# Patient Record
Sex: Female | Born: 1950 | Race: White | Hispanic: No | Smoking: Current every day smoker
Health system: Southern US, Community
[De-identification: ages and names within clinical notes are randomized; demographics above are authoritative.]

## PROBLEM LIST (undated history)

## (undated) DIAGNOSIS — M1990 Unspecified osteoarthritis, unspecified site: Secondary | ICD-10-CM

## (undated) DIAGNOSIS — J302 Other seasonal allergic rhinitis: Secondary | ICD-10-CM

## (undated) HISTORY — PX: DILATION AND CURETTAGE OF UTERUS: SHX78

## (undated) HISTORY — DX: Other seasonal allergic rhinitis: J30.2

## (undated) HISTORY — PX: BREAST BIOPSY: SHX20

## (undated) HISTORY — PX: OTHER SURGICAL HISTORY: SHX169

## (undated) HISTORY — DX: Unspecified osteoarthritis, unspecified site: M19.90

## (undated) HISTORY — PX: BREAST EXCISIONAL BIOPSY: SUR124

---

## 2001-01-25 ENCOUNTER — Encounter: Payer: Self-pay | Admitting: Neurosurgery

## 2001-01-25 ENCOUNTER — Inpatient Hospital Stay (HOSPITAL_COMMUNITY): Admission: RE | Admit: 2001-01-25 | Discharge: 2001-01-26 | Payer: Self-pay | Admitting: Neurosurgery

## 2001-02-17 ENCOUNTER — Encounter: Admission: RE | Admit: 2001-02-17 | Discharge: 2001-02-17 | Payer: Self-pay | Admitting: Neurosurgery

## 2001-02-17 ENCOUNTER — Encounter: Payer: Self-pay | Admitting: Neurosurgery

## 2005-02-25 ENCOUNTER — Ambulatory Visit (HOSPITAL_COMMUNITY): Admission: RE | Admit: 2005-02-25 | Discharge: 2005-02-25 | Payer: Self-pay | Admitting: Family Medicine

## 2005-03-26 ENCOUNTER — Ambulatory Visit (HOSPITAL_COMMUNITY): Admission: RE | Admit: 2005-03-26 | Discharge: 2005-03-26 | Payer: Self-pay | Admitting: Internal Medicine

## 2005-03-26 ENCOUNTER — Ambulatory Visit: Payer: Self-pay | Admitting: Internal Medicine

## 2006-08-31 ENCOUNTER — Ambulatory Visit (HOSPITAL_COMMUNITY): Admission: RE | Admit: 2006-08-31 | Discharge: 2006-08-31 | Payer: Self-pay | Admitting: Obstetrics and Gynecology

## 2012-05-11 ENCOUNTER — Other Ambulatory Visit (HOSPITAL_COMMUNITY): Payer: Self-pay | Admitting: Family Medicine

## 2012-05-11 ENCOUNTER — Ambulatory Visit (HOSPITAL_COMMUNITY)
Admission: RE | Admit: 2012-05-11 | Discharge: 2012-05-11 | Disposition: A | Discharge status: Home / Self Care | Payer: 59 | Source: Ambulatory Visit | Attending: Family Medicine | Admitting: Family Medicine

## 2012-05-11 DIAGNOSIS — R5383 Other fatigue: Secondary | ICD-10-CM

## 2012-05-11 DIAGNOSIS — Z139 Encounter for screening, unspecified: Secondary | ICD-10-CM

## 2012-05-11 DIAGNOSIS — F17200 Nicotine dependence, unspecified, uncomplicated: Secondary | ICD-10-CM | POA: Insufficient documentation

## 2012-05-11 DIAGNOSIS — R05 Cough: Secondary | ICD-10-CM | POA: Insufficient documentation

## 2012-05-11 DIAGNOSIS — R5381 Other malaise: Secondary | ICD-10-CM

## 2012-05-11 DIAGNOSIS — R059 Cough, unspecified: Secondary | ICD-10-CM | POA: Insufficient documentation

## 2012-05-16 ENCOUNTER — Ambulatory Visit (HOSPITAL_COMMUNITY)
Admission: RE | Admit: 2012-05-16 | Discharge: 2012-05-16 | Disposition: A | Discharge status: Home / Self Care | Payer: 59 | Source: Ambulatory Visit | Attending: Family Medicine | Admitting: Family Medicine

## 2012-05-16 DIAGNOSIS — Z1231 Encounter for screening mammogram for malignant neoplasm of breast: Secondary | ICD-10-CM | POA: Insufficient documentation

## 2012-05-16 DIAGNOSIS — Z139 Encounter for screening, unspecified: Secondary | ICD-10-CM

## 2013-06-19 IMAGING — MG MM DIGITAL SCREENING BILAT
4 series · 4 of 4 positions shown · non-contrast
Comparison: Previous exams.

CLINICAL DATA: Screening.

DIGITAL BILATERAL SCREENING MAMMOGRAM WITH CAD

[L CC]
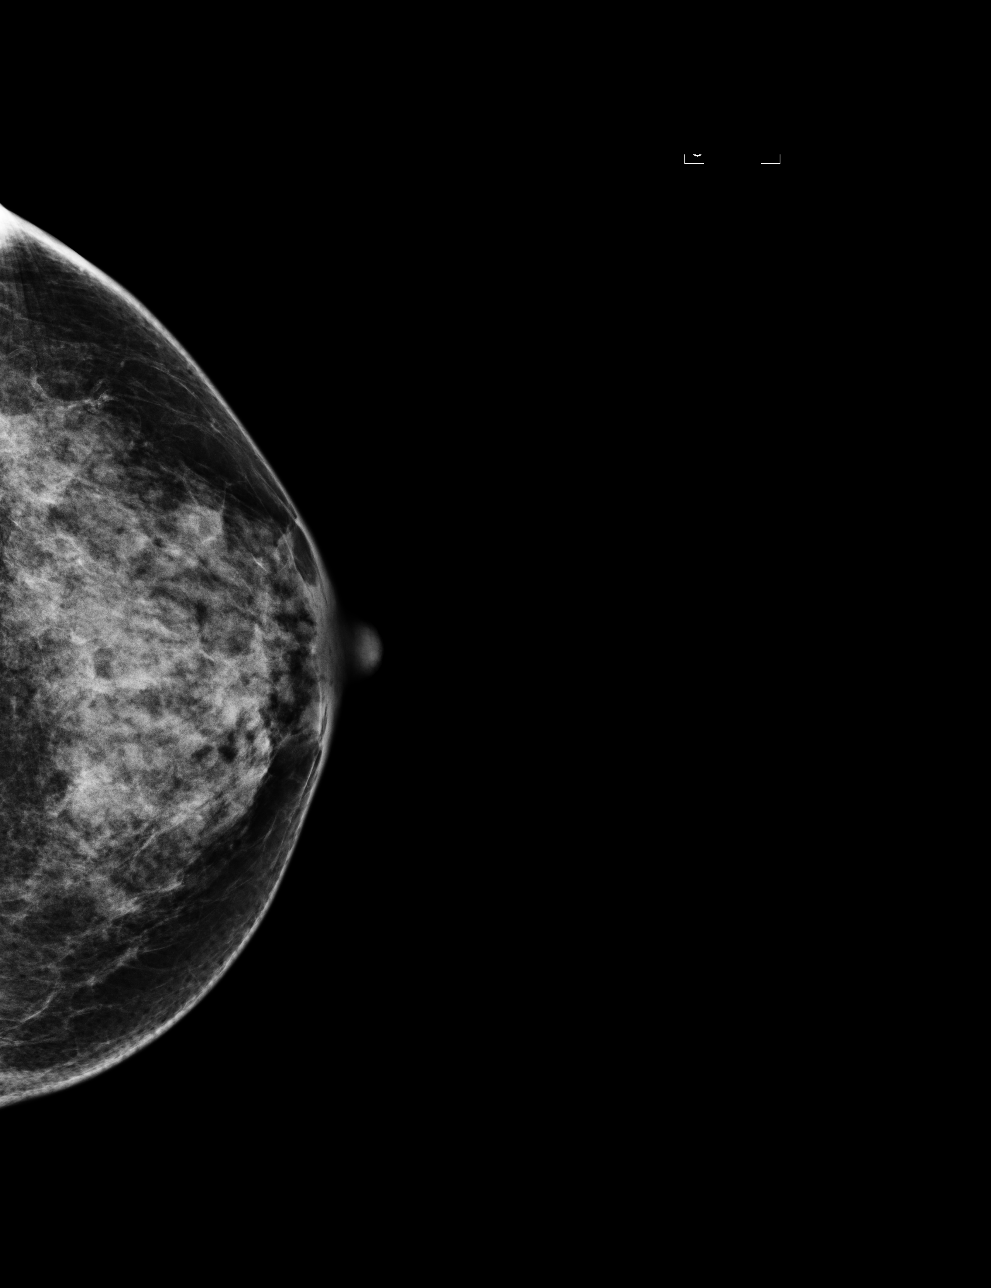

[L MLO]
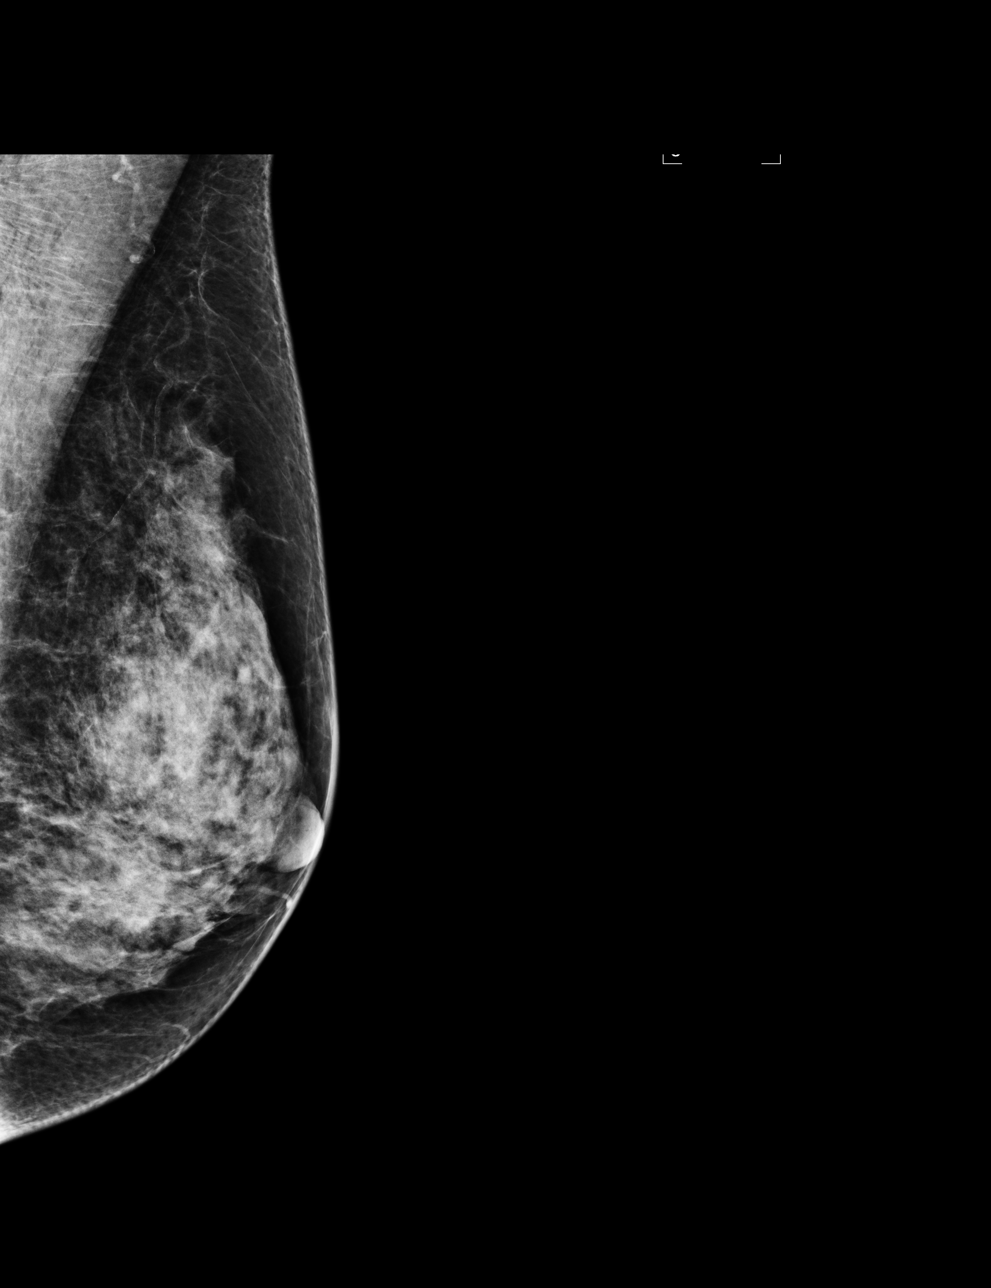

[R CC]
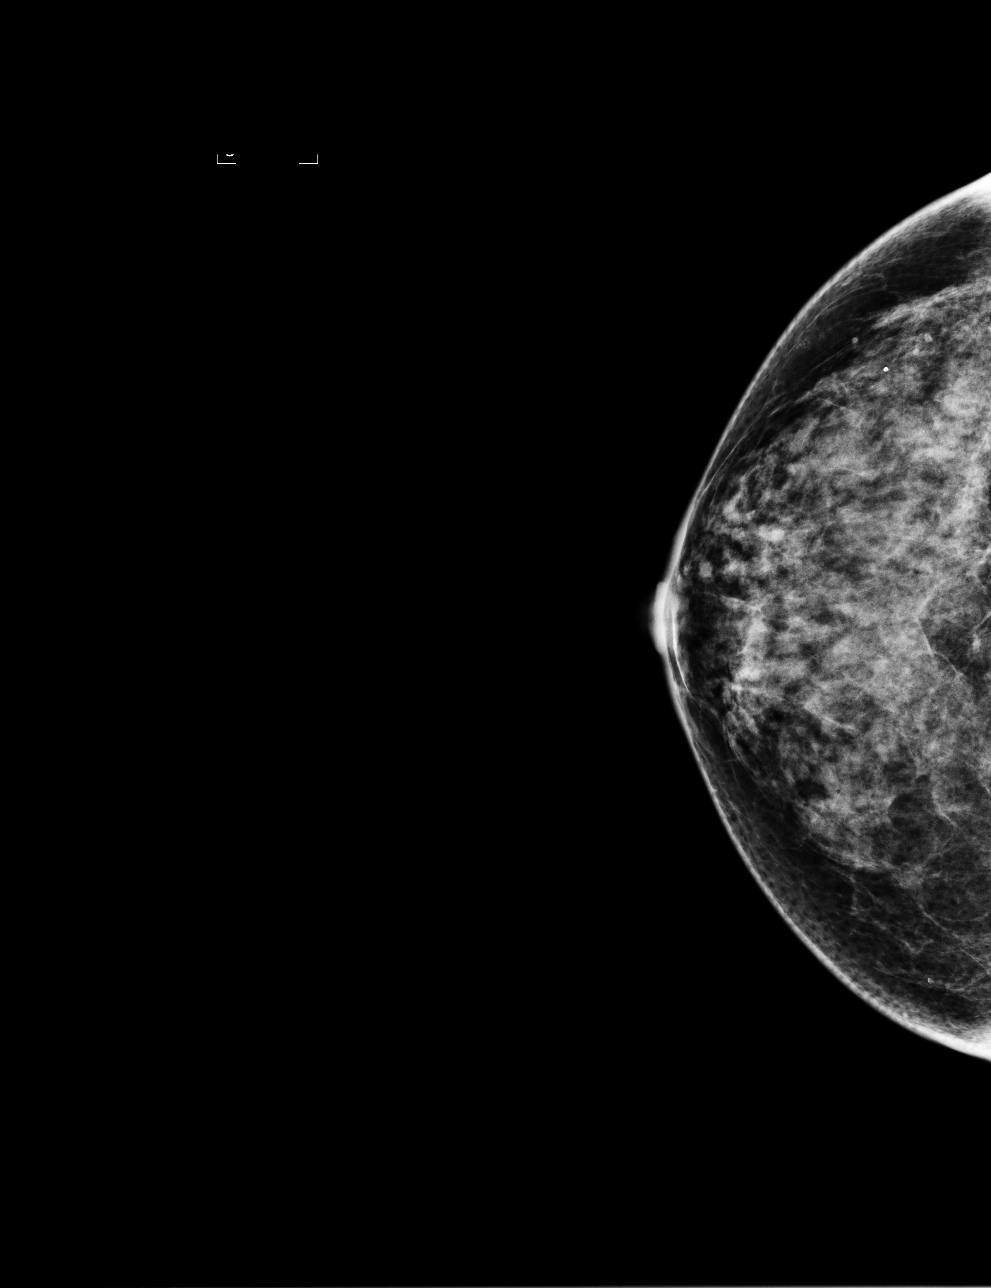

[R MLO]
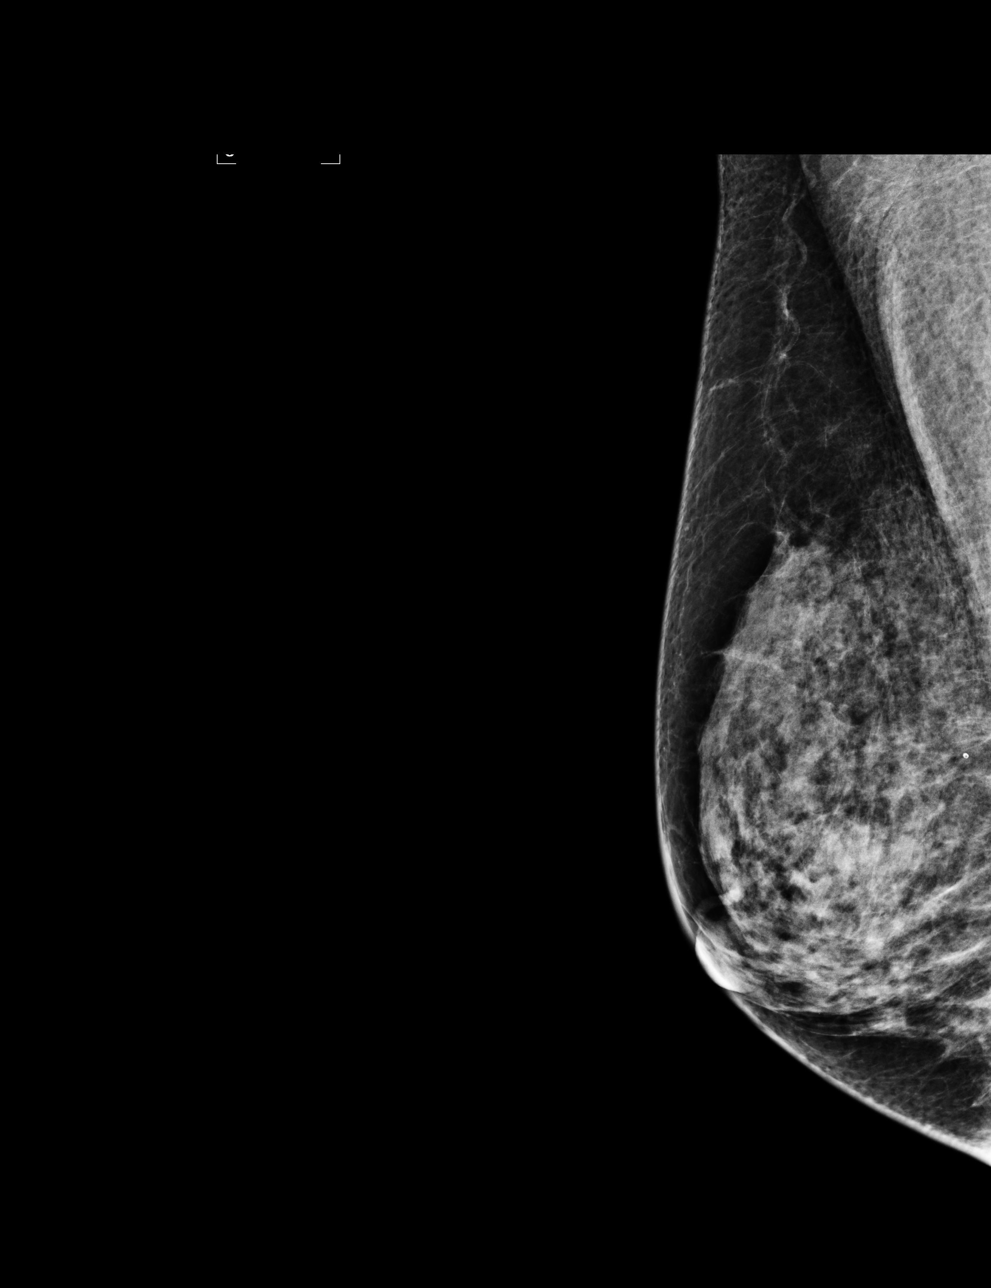

[4 of 4 positions shown; findings below may reference images not displayed]

FINDINGS: The breast tissue is extremely dense. No suspicious
masses, architectural distortion, or calcifications are present.

Images were processed with CAD.
IMPRESSION: No mammographic evidence of malignancy.

A result letter of this screening mammogram will be mailed directly
to the patient.

RECOMMENDATION:
Screening mammogram in one year. (Code:FL-P-WKB)

BI-RADS CATEGORY 1:  Negative.

## 2014-03-08 ENCOUNTER — Other Ambulatory Visit (HOSPITAL_COMMUNITY): Payer: Self-pay | Admitting: Family Medicine

## 2014-03-08 DIAGNOSIS — Z139 Encounter for screening, unspecified: Secondary | ICD-10-CM

## 2014-03-15 ENCOUNTER — Ambulatory Visit (HOSPITAL_COMMUNITY)
Admission: RE | Admit: 2014-03-15 | Discharge: 2014-03-15 | Disposition: A | Discharge status: Home / Self Care | Payer: 59 | Source: Ambulatory Visit | Attending: Family Medicine | Admitting: Family Medicine

## 2014-03-15 DIAGNOSIS — Z1231 Encounter for screening mammogram for malignant neoplasm of breast: Secondary | ICD-10-CM | POA: Insufficient documentation

## 2014-03-15 DIAGNOSIS — Z139 Encounter for screening, unspecified: Secondary | ICD-10-CM

## 2014-03-15 DIAGNOSIS — R928 Other abnormal and inconclusive findings on diagnostic imaging of breast: Secondary | ICD-10-CM | POA: Insufficient documentation

## 2014-03-19 ENCOUNTER — Other Ambulatory Visit: Payer: Self-pay | Admitting: Family Medicine

## 2014-03-19 DIAGNOSIS — R928 Other abnormal and inconclusive findings on diagnostic imaging of breast: Secondary | ICD-10-CM

## 2014-04-03 ENCOUNTER — Other Ambulatory Visit: Payer: Self-pay | Admitting: Family Medicine

## 2014-04-03 ENCOUNTER — Ambulatory Visit (HOSPITAL_COMMUNITY)
Admission: RE | Admit: 2014-04-03 | Discharge: 2014-04-03 | Disposition: A | Discharge status: Home / Self Care | Payer: 59 | Source: Ambulatory Visit | Attending: Family Medicine | Admitting: Family Medicine

## 2014-04-03 DIAGNOSIS — R928 Other abnormal and inconclusive findings on diagnostic imaging of breast: Secondary | ICD-10-CM

## 2015-05-07 ENCOUNTER — Other Ambulatory Visit (HOSPITAL_COMMUNITY): Payer: Self-pay | Admitting: Physician Assistant

## 2015-05-07 ENCOUNTER — Ambulatory Visit (HOSPITAL_COMMUNITY)
Admission: RE | Admit: 2015-05-07 | Discharge: 2015-05-07 | Disposition: A | Discharge status: Home / Self Care | Payer: 59 | Source: Ambulatory Visit | Attending: Physician Assistant | Admitting: Physician Assistant

## 2015-05-07 DIAGNOSIS — M25551 Pain in right hip: Secondary | ICD-10-CM | POA: Diagnosis not present

## 2015-06-05 ENCOUNTER — Encounter (INDEPENDENT_AMBULATORY_CARE_PROVIDER_SITE_OTHER): Payer: Self-pay | Admitting: *Deleted

## 2015-07-03 ENCOUNTER — Ambulatory Visit (INDEPENDENT_AMBULATORY_CARE_PROVIDER_SITE_OTHER): Payer: 59 | Admitting: Internal Medicine

## 2015-08-05 ENCOUNTER — Encounter (INDEPENDENT_AMBULATORY_CARE_PROVIDER_SITE_OTHER): Payer: Self-pay | Admitting: Internal Medicine

## 2015-08-05 ENCOUNTER — Ambulatory Visit (INDEPENDENT_AMBULATORY_CARE_PROVIDER_SITE_OTHER): Payer: 59 | Admitting: Internal Medicine

## 2015-08-05 ENCOUNTER — Telehealth (INDEPENDENT_AMBULATORY_CARE_PROVIDER_SITE_OTHER): Payer: Self-pay | Admitting: *Deleted

## 2015-08-05 ENCOUNTER — Other Ambulatory Visit (INDEPENDENT_AMBULATORY_CARE_PROVIDER_SITE_OTHER): Payer: Self-pay | Admitting: Internal Medicine

## 2015-08-05 DIAGNOSIS — K921 Melena: Secondary | ICD-10-CM | POA: Diagnosis not present

## 2015-08-05 DIAGNOSIS — Z1211 Encounter for screening for malignant neoplasm of colon: Secondary | ICD-10-CM

## 2015-08-05 DIAGNOSIS — J302 Other seasonal allergic rhinitis: Secondary | ICD-10-CM | POA: Insufficient documentation

## 2015-08-05 DIAGNOSIS — M1990 Unspecified osteoarthritis, unspecified site: Secondary | ICD-10-CM | POA: Insufficient documentation

## 2015-08-05 LAB — CBC
HEMATOCRIT: 42.2 % (ref 36.0–46.0)
Hemoglobin: 14.2 g/dL (ref 12.0–15.0)
MCH: 32.1 pg (ref 26.0–34.0)
MCHC: 33.6 g/dL (ref 30.0–36.0)
MCV: 95.3 fL (ref 78.0–100.0)
MPV: 8.9 fL (ref 8.6–12.4)
PLATELETS: 301 10*3/uL (ref 150–400)
RBC: 4.43 MIL/uL (ref 3.87–5.11)
RDW: 13.4 % (ref 11.5–15.5)
WBC: 6.7 10*3/uL (ref 4.0–10.5)

## 2015-08-05 MED ORDER — OMEPRAZOLE 40 MG PO CPDR: 40.0000 mg | DELAYED_RELEASE_CAPSULE | Freq: Every day | ORAL | Status: DC
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
## 2015-08-05 NOTE — Patient Instructions (Signed)
EGD/Colonoscopy. The risks and benefits such as perforation, bleeding, and infection were reviewed with the patient and is agreeable. 

## 2015-08-05 NOTE — Telephone Encounter (Signed)
Patient needs trilyte 

## 2015-08-11 MED ORDER — PEG 3350-KCL-NA BICARB-NACL 420 G PO SOLR: 4000.0000 mL | Freq: Once | ORAL | Status: DC

## 2015-09-25 ENCOUNTER — Telehealth (INDEPENDENT_AMBULATORY_CARE_PROVIDER_SITE_OTHER): Payer: Self-pay | Admitting: *Deleted

## 2015-09-25 NOTE — Telephone Encounter (Signed)
noted 

## 2015-09-25 NOTE — Telephone Encounter (Signed)
Patient left message to cancel TCS/EGD sch'd 10/16/15 -- Hospice has been called in for her mother and she states she won't be up to having procedures

## 2015-10-16 ENCOUNTER — Encounter (HOSPITAL_COMMUNITY): Admission: RE | Payer: Self-pay | Source: Ambulatory Visit

## 2015-10-16 ENCOUNTER — Ambulatory Visit (HOSPITAL_COMMUNITY): Admission: RE | Admit: 2015-10-16 | Payer: 59 | Source: Ambulatory Visit | Admitting: Internal Medicine

## 2015-10-16 SURGERY — COLONOSCOPY
Anesthesia: Moderate Sedation

## 2017-02-10 DIAGNOSIS — Z1389 Encounter for screening for other disorder: Secondary | ICD-10-CM | POA: Diagnosis not present

## 2017-02-10 DIAGNOSIS — T07XXXA Unspecified multiple injuries, initial encounter: Secondary | ICD-10-CM | POA: Diagnosis not present

## 2017-02-10 DIAGNOSIS — K5790 Diverticulosis of intestine, part unspecified, without perforation or abscess without bleeding: Secondary | ICD-10-CM | POA: Diagnosis not present

## 2017-02-10 DIAGNOSIS — Z23 Encounter for immunization: Secondary | ICD-10-CM | POA: Diagnosis not present

## 2017-02-10 DIAGNOSIS — L299 Pruritus, unspecified: Secondary | ICD-10-CM | POA: Diagnosis not present

## 2017-08-15 DIAGNOSIS — R599 Enlarged lymph nodes, unspecified: Secondary | ICD-10-CM | POA: Diagnosis not present

## 2017-08-15 DIAGNOSIS — Z6820 Body mass index (BMI) 20.0-20.9, adult: Secondary | ICD-10-CM | POA: Diagnosis not present

## 2017-08-15 DIAGNOSIS — Z1389 Encounter for screening for other disorder: Secondary | ICD-10-CM | POA: Diagnosis not present

## 2017-08-15 DIAGNOSIS — R221 Localized swelling, mass and lump, neck: Secondary | ICD-10-CM | POA: Diagnosis not present

## 2017-09-08 ENCOUNTER — Ambulatory Visit (INDEPENDENT_AMBULATORY_CARE_PROVIDER_SITE_OTHER): Payer: 59 | Admitting: Otolaryngology

## 2017-09-08 DIAGNOSIS — F17210 Nicotine dependence, cigarettes, uncomplicated: Secondary | ICD-10-CM | POA: Diagnosis not present

## 2017-09-08 DIAGNOSIS — R070 Pain in throat: Secondary | ICD-10-CM

## 2017-09-08 DIAGNOSIS — R221 Localized swelling, mass and lump, neck: Secondary | ICD-10-CM | POA: Diagnosis not present

## 2017-09-14 ENCOUNTER — Other Ambulatory Visit (INDEPENDENT_AMBULATORY_CARE_PROVIDER_SITE_OTHER): Payer: Self-pay | Admitting: Otolaryngology

## 2017-09-14 DIAGNOSIS — R221 Localized swelling, mass and lump, neck: Secondary | ICD-10-CM

## 2017-09-23 ENCOUNTER — Ambulatory Visit (HOSPITAL_COMMUNITY): Payer: 59

## 2017-10-07 ENCOUNTER — Other Ambulatory Visit (HOSPITAL_COMMUNITY): Payer: 59

## 2017-10-07 ENCOUNTER — Encounter (HOSPITAL_COMMUNITY): Payer: Self-pay

## 2017-10-18 ENCOUNTER — Ambulatory Visit (HOSPITAL_COMMUNITY)
Admission: RE | Admit: 2017-10-18 | Discharge: 2017-10-18 | Disposition: A | Discharge status: Home / Self Care | Payer: 59 | Source: Ambulatory Visit | Attending: Otolaryngology | Admitting: Otolaryngology

## 2017-10-18 DIAGNOSIS — R221 Localized swelling, mass and lump, neck: Secondary | ICD-10-CM | POA: Diagnosis not present

## 2017-10-18 DIAGNOSIS — I700 Atherosclerosis of aorta: Secondary | ICD-10-CM | POA: Diagnosis not present

## 2017-10-18 LAB — POCT I-STAT CREATININE: Creatinine, Ser: 0.8 mg/dL (ref 0.44–1.00)

## 2017-10-18 MED ORDER — IOPAMIDOL (ISOVUE-300) INJECTION 61%
75.0000 mL | Freq: Once | INTRAVENOUS | Status: AC | PRN
  Administered 2017-10-18: 75 mL via INTRAVENOUS

## 2018-06-22 ENCOUNTER — Other Ambulatory Visit (HOSPITAL_COMMUNITY): Payer: Self-pay | Admitting: Family Medicine

## 2018-06-22 ENCOUNTER — Ambulatory Visit (HOSPITAL_COMMUNITY)
Admission: RE | Admit: 2018-06-22 | Discharge: 2018-06-22 | Disposition: A | Discharge status: Home / Self Care | Payer: 59 | Source: Ambulatory Visit | Attending: Family Medicine | Admitting: Family Medicine

## 2018-06-22 DIAGNOSIS — R0600 Dyspnea, unspecified: Secondary | ICD-10-CM | POA: Diagnosis not present

## 2018-06-22 DIAGNOSIS — Z1389 Encounter for screening for other disorder: Secondary | ICD-10-CM

## 2018-06-22 DIAGNOSIS — R0602 Shortness of breath: Secondary | ICD-10-CM | POA: Diagnosis not present

## 2018-06-22 DIAGNOSIS — Z6821 Body mass index (BMI) 21.0-21.9, adult: Secondary | ICD-10-CM | POA: Diagnosis not present

## 2018-06-22 DIAGNOSIS — R071 Chest pain on breathing: Secondary | ICD-10-CM | POA: Diagnosis not present

## 2018-07-27 DIAGNOSIS — R69 Illness, unspecified: Secondary | ICD-10-CM | POA: Diagnosis not present

## 2018-11-21 DIAGNOSIS — R69 Illness, unspecified: Secondary | ICD-10-CM | POA: Diagnosis not present

## 2019-07-18 DIAGNOSIS — Z23 Encounter for immunization: Secondary | ICD-10-CM | POA: Diagnosis not present

## 2019-07-24 DIAGNOSIS — Z6820 Body mass index (BMI) 20.0-20.9, adult: Secondary | ICD-10-CM | POA: Diagnosis not present

## 2019-07-24 DIAGNOSIS — L02224 Furuncle of groin: Secondary | ICD-10-CM | POA: Diagnosis not present

## 2019-07-24 DIAGNOSIS — Z1389 Encounter for screening for other disorder: Secondary | ICD-10-CM | POA: Diagnosis not present

## 2019-07-31 ENCOUNTER — Telehealth: Payer: Self-pay | Admitting: Obstetrics and Gynecology

## 2019-07-31 NOTE — Telephone Encounter (Signed)

## 2019-08-01 ENCOUNTER — Other Ambulatory Visit: Payer: Self-pay

## 2019-08-01 ENCOUNTER — Encounter: Payer: Self-pay | Admitting: Obstetrics and Gynecology

## 2019-08-01 ENCOUNTER — Ambulatory Visit (INDEPENDENT_AMBULATORY_CARE_PROVIDER_SITE_OTHER): Payer: Medicare HMO | Admitting: Obstetrics and Gynecology

## 2019-08-01 ENCOUNTER — Other Ambulatory Visit (HOSPITAL_COMMUNITY)
Admission: RE | Admit: 2019-08-01 | Discharge: 2019-08-01 | Disposition: A | Discharge status: Home / Self Care | Payer: Medicare HMO | Source: Ambulatory Visit | Attending: Obstetrics and Gynecology | Admitting: Obstetrics and Gynecology

## 2019-08-01 VITALS — BP 152/89 | HR 82 | Ht 64.0 in | Wt 112.6 lb

## 2019-08-01 DIAGNOSIS — Z124 Encounter for screening for malignant neoplasm of cervix: Secondary | ICD-10-CM | POA: Diagnosis not present

## 2019-08-01 DIAGNOSIS — Z1151 Encounter for screening for human papillomavirus (HPV): Secondary | ICD-10-CM | POA: Insufficient documentation

## 2019-08-01 DIAGNOSIS — Z780 Asymptomatic menopausal state: Secondary | ICD-10-CM | POA: Insufficient documentation

## 2019-08-01 DIAGNOSIS — Z1211 Encounter for screening for malignant neoplasm of colon: Secondary | ICD-10-CM

## 2019-08-01 DIAGNOSIS — Z1212 Encounter for screening for malignant neoplasm of rectum: Secondary | ICD-10-CM

## 2019-08-01 DIAGNOSIS — R69 Illness, unspecified: Secondary | ICD-10-CM | POA: Diagnosis not present

## 2019-08-01 LAB — HEMOCCULT GUIAC POC 1CARD (OFFICE): Fecal Occult Blood, POC: NEGATIVE
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
## 2019-08-03 DIAGNOSIS — L270 Generalized skin eruption due to drugs and medicaments taken internally: Secondary | ICD-10-CM | POA: Diagnosis not present

## 2019-08-03 DIAGNOSIS — Z6820 Body mass index (BMI) 20.0-20.9, adult: Secondary | ICD-10-CM | POA: Diagnosis not present

## 2019-08-08 LAB — CYTOLOGY - PAP
Comment: NEGATIVE
Diagnosis: NEGATIVE
High risk HPV: NEGATIVE

## 2019-08-13 DIAGNOSIS — R69 Illness, unspecified: Secondary | ICD-10-CM | POA: Diagnosis not present

## 2019-08-16 DIAGNOSIS — Z1389 Encounter for screening for other disorder: Secondary | ICD-10-CM | POA: Diagnosis not present

## 2019-08-16 DIAGNOSIS — J302 Other seasonal allergic rhinitis: Secondary | ICD-10-CM | POA: Diagnosis not present

## 2019-08-16 DIAGNOSIS — Z719 Counseling, unspecified: Secondary | ICD-10-CM | POA: Diagnosis not present

## 2019-08-16 DIAGNOSIS — E782 Mixed hyperlipidemia: Secondary | ICD-10-CM | POA: Diagnosis not present

## 2019-08-16 DIAGNOSIS — J449 Chronic obstructive pulmonary disease, unspecified: Secondary | ICD-10-CM | POA: Diagnosis not present

## 2019-08-16 DIAGNOSIS — Z0000 Encounter for general adult medical examination without abnormal findings: Secondary | ICD-10-CM | POA: Diagnosis not present

## 2019-08-16 DIAGNOSIS — Z6820 Body mass index (BMI) 20.0-20.9, adult: Secondary | ICD-10-CM | POA: Diagnosis not present

## 2019-08-16 DIAGNOSIS — Z0001 Encounter for general adult medical examination with abnormal findings: Secondary | ICD-10-CM | POA: Diagnosis not present

## 2019-08-16 DIAGNOSIS — E039 Hypothyroidism, unspecified: Secondary | ICD-10-CM | POA: Diagnosis not present

## 2019-08-16 DIAGNOSIS — K5790 Diverticulosis of intestine, part unspecified, without perforation or abscess without bleeding: Secondary | ICD-10-CM | POA: Diagnosis not present

## 2019-11-19 DIAGNOSIS — R69 Illness, unspecified: Secondary | ICD-10-CM | POA: Diagnosis not present

## 2019-11-21 DIAGNOSIS — R69 Illness, unspecified: Secondary | ICD-10-CM | POA: Diagnosis not present

## 2020-02-12 DIAGNOSIS — R69 Illness, unspecified: Secondary | ICD-10-CM | POA: Diagnosis not present

## 2020-02-21 DIAGNOSIS — L308 Other specified dermatitis: Secondary | ICD-10-CM | POA: Diagnosis not present

## 2020-02-27 DIAGNOSIS — H25811 Combined forms of age-related cataract, right eye: Secondary | ICD-10-CM | POA: Diagnosis not present

## 2020-04-17 DIAGNOSIS — H2511 Age-related nuclear cataract, right eye: Secondary | ICD-10-CM | POA: Diagnosis not present

## 2020-04-17 DIAGNOSIS — H35362 Drusen (degenerative) of macula, left eye: Secondary | ICD-10-CM | POA: Diagnosis not present

## 2020-04-17 DIAGNOSIS — H25011 Cortical age-related cataract, right eye: Secondary | ICD-10-CM | POA: Diagnosis not present

## 2020-04-17 DIAGNOSIS — H25041 Posterior subcapsular polar age-related cataract, right eye: Secondary | ICD-10-CM | POA: Diagnosis not present

## 2020-04-17 DIAGNOSIS — H2589 Other age-related cataract: Secondary | ICD-10-CM | POA: Diagnosis not present

## 2020-05-06 DIAGNOSIS — H2511 Age-related nuclear cataract, right eye: Secondary | ICD-10-CM | POA: Diagnosis not present

## 2020-05-06 DIAGNOSIS — H25011 Cortical age-related cataract, right eye: Secondary | ICD-10-CM | POA: Diagnosis not present

## 2020-05-06 DIAGNOSIS — H25811 Combined forms of age-related cataract, right eye: Secondary | ICD-10-CM | POA: Diagnosis not present

## 2020-05-06 DIAGNOSIS — H2589 Other age-related cataract: Secondary | ICD-10-CM | POA: Diagnosis not present

## 2020-05-06 DIAGNOSIS — H25041 Posterior subcapsular polar age-related cataract, right eye: Secondary | ICD-10-CM | POA: Diagnosis not present

## 2020-08-08 DIAGNOSIS — R69 Illness, unspecified: Secondary | ICD-10-CM | POA: Diagnosis not present

## 2020-08-25 DIAGNOSIS — M5030 Other cervical disc degeneration, unspecified cervical region: Secondary | ICD-10-CM | POA: Diagnosis not present

## 2020-08-25 DIAGNOSIS — K5732 Diverticulitis of large intestine without perforation or abscess without bleeding: Secondary | ICD-10-CM | POA: Diagnosis not present

## 2020-08-25 DIAGNOSIS — Z681 Body mass index (BMI) 19 or less, adult: Secondary | ICD-10-CM | POA: Diagnosis not present

## 2020-08-25 DIAGNOSIS — M542 Cervicalgia: Secondary | ICD-10-CM | POA: Diagnosis not present

## 2020-08-25 DIAGNOSIS — Z1331 Encounter for screening for depression: Secondary | ICD-10-CM | POA: Diagnosis not present

## 2020-11-25 DIAGNOSIS — Z720 Tobacco use: Secondary | ICD-10-CM | POA: Diagnosis not present

## 2020-11-25 DIAGNOSIS — Z0001 Encounter for general adult medical examination with abnormal findings: Secondary | ICD-10-CM | POA: Diagnosis not present

## 2020-11-25 DIAGNOSIS — Z1389 Encounter for screening for other disorder: Secondary | ICD-10-CM | POA: Diagnosis not present

## 2020-11-25 DIAGNOSIS — E7849 Other hyperlipidemia: Secondary | ICD-10-CM | POA: Diagnosis not present

## 2020-11-25 DIAGNOSIS — J302 Other seasonal allergic rhinitis: Secondary | ICD-10-CM | POA: Diagnosis not present

## 2020-11-25 DIAGNOSIS — Z1331 Encounter for screening for depression: Secondary | ICD-10-CM | POA: Diagnosis not present

## 2020-11-25 DIAGNOSIS — Z681 Body mass index (BMI) 19 or less, adult: Secondary | ICD-10-CM | POA: Diagnosis not present

## 2020-11-27 ENCOUNTER — Other Ambulatory Visit (HOSPITAL_COMMUNITY): Payer: Self-pay | Admitting: Family Medicine

## 2020-11-27 DIAGNOSIS — L821 Other seborrheic keratosis: Secondary | ICD-10-CM | POA: Diagnosis not present

## 2020-11-27 DIAGNOSIS — L820 Inflamed seborrheic keratosis: Secondary | ICD-10-CM | POA: Diagnosis not present

## 2020-11-27 DIAGNOSIS — D225 Melanocytic nevi of trunk: Secondary | ICD-10-CM | POA: Diagnosis not present

## 2020-11-27 DIAGNOSIS — E2839 Other primary ovarian failure: Secondary | ICD-10-CM

## 2020-11-27 DIAGNOSIS — Z1231 Encounter for screening mammogram for malignant neoplasm of breast: Secondary | ICD-10-CM

## 2020-12-01 ENCOUNTER — Encounter (INDEPENDENT_AMBULATORY_CARE_PROVIDER_SITE_OTHER): Payer: Self-pay | Admitting: *Deleted

## 2020-12-11 DIAGNOSIS — H353132 Nonexudative age-related macular degeneration, bilateral, intermediate dry stage: Secondary | ICD-10-CM | POA: Diagnosis not present

## 2021-02-06 ENCOUNTER — Encounter (INDEPENDENT_AMBULATORY_CARE_PROVIDER_SITE_OTHER): Payer: Self-pay | Admitting: *Deleted

## 2021-02-10 ENCOUNTER — Encounter (INDEPENDENT_AMBULATORY_CARE_PROVIDER_SITE_OTHER): Payer: Self-pay | Admitting: *Deleted

## 2021-02-10 ENCOUNTER — Telehealth (INDEPENDENT_AMBULATORY_CARE_PROVIDER_SITE_OTHER): Payer: Self-pay | Admitting: *Deleted

## 2021-02-10 MED ORDER — PEG 3350-KCL-NA BICARB-NACL 420 G PO SOLR: 4000.0000 mL | Freq: Once | ORAL | 0 refills | Status: AC

## 2021-02-10 NOTE — Telephone Encounter (Signed)
Patient needs trilyte 

## 2021-02-10 NOTE — Telephone Encounter (Signed)
Done

## 2021-02-12 ENCOUNTER — Other Ambulatory Visit (INDEPENDENT_AMBULATORY_CARE_PROVIDER_SITE_OTHER): Payer: Self-pay

## 2021-02-12 DIAGNOSIS — Z1211 Encounter for screening for malignant neoplasm of colon: Secondary | ICD-10-CM

## 2021-02-25 ENCOUNTER — Telehealth (INDEPENDENT_AMBULATORY_CARE_PROVIDER_SITE_OTHER): Payer: Self-pay | Admitting: *Deleted

## 2021-02-25 NOTE — Telephone Encounter (Signed)
Referring MD/PCP: golding  Procedure: tcs  Reason/Indication:  screening  Has patient had this procedure before?  no  If so, when, by whom and where?    Is there a family history of colon cancer?  no  Who?  What age when diagnosed?    Is patient diabetic? If yes, Type 1 or Type 2   no      Does patient have prosthetic heart valve or mechanical valve?  no  Do you have a pacemaker/defibrillator?  no  Has patient ever had endocarditis/atrial fibrillation? no  Have you had a stroke/heart attack last 6 mths? no  Does patient use oxygen? no  Has patient had joint replacement within last 12 months?  no  Is patient constipated or do they take laxatives? no  Does patient have a history of alcohol/drug use?  no  Is patient on blood thinner such as Coumadin, Plavix and/or Aspirin? yes  Do you take medicine for weight loss?  no  For female patients,: do you still have your menstrual cycle?   Medications: ventolin 90 mcg daily, cebtrum silver daily, CoQ 10 daily, allegra daily, preservision daily  Allergies: sulfur  Medication Adjustment per Dr Rehman/Dr Jenetta Downer: asa 2 days  Procedure date & time: 03/26/21

## 2021-03-25 ENCOUNTER — Other Ambulatory Visit (HOSPITAL_COMMUNITY): Payer: Medicare HMO

## 2021-03-26 ENCOUNTER — Encounter (HOSPITAL_COMMUNITY)
Admission: RE | Disposition: A | Discharge status: Home / Self Care | Payer: Self-pay | Source: Home / Self Care | Attending: Internal Medicine

## 2021-03-26 ENCOUNTER — Ambulatory Visit (HOSPITAL_COMMUNITY)
Admission: RE | Admit: 2021-03-26 | Discharge: 2021-03-26 | Disposition: A | Discharge status: Home / Self Care | Payer: Medicare HMO | Attending: Internal Medicine | Admitting: Internal Medicine

## 2021-03-26 ENCOUNTER — Other Ambulatory Visit: Payer: Self-pay

## 2021-03-26 ENCOUNTER — Encounter (HOSPITAL_COMMUNITY): Payer: Self-pay | Admitting: Internal Medicine

## 2021-03-26 DIAGNOSIS — Z881 Allergy status to other antibiotic agents status: Secondary | ICD-10-CM | POA: Insufficient documentation

## 2021-03-26 DIAGNOSIS — Z1211 Encounter for screening for malignant neoplasm of colon: Secondary | ICD-10-CM | POA: Diagnosis not present

## 2021-03-26 DIAGNOSIS — K5730 Diverticulosis of large intestine without perforation or abscess without bleeding: Secondary | ICD-10-CM | POA: Insufficient documentation

## 2021-03-26 DIAGNOSIS — F17200 Nicotine dependence, unspecified, uncomplicated: Secondary | ICD-10-CM | POA: Insufficient documentation

## 2021-03-26 DIAGNOSIS — D122 Benign neoplasm of ascending colon: Secondary | ICD-10-CM | POA: Diagnosis not present

## 2021-03-26 DIAGNOSIS — R69 Illness, unspecified: Secondary | ICD-10-CM | POA: Diagnosis not present

## 2021-03-26 DIAGNOSIS — Z882 Allergy status to sulfonamides status: Secondary | ICD-10-CM | POA: Insufficient documentation

## 2021-03-26 DIAGNOSIS — Z79899 Other long term (current) drug therapy: Secondary | ICD-10-CM | POA: Insufficient documentation

## 2021-03-26 DIAGNOSIS — D123 Benign neoplasm of transverse colon: Secondary | ICD-10-CM | POA: Insufficient documentation

## 2021-03-26 DIAGNOSIS — Z8249 Family history of ischemic heart disease and other diseases of the circulatory system: Secondary | ICD-10-CM | POA: Diagnosis not present

## 2021-03-26 DIAGNOSIS — K644 Residual hemorrhoidal skin tags: Secondary | ICD-10-CM | POA: Insufficient documentation

## 2021-03-26 DIAGNOSIS — K648 Other hemorrhoids: Secondary | ICD-10-CM | POA: Diagnosis not present

## 2021-03-26 HISTORY — PX: POLYPECTOMY: SHX5525

## 2021-03-26 HISTORY — PX: COLONOSCOPY: SHX5424

## 2021-03-26 LAB — HM COLONOSCOPY

## 2021-03-26 SURGERY — COLONOSCOPY
Anesthesia: Moderate Sedation

## 2021-03-26 MED ORDER — MIDAZOLAM HCL 5 MG/5ML IJ SOLN
INTRAMUSCULAR | Status: AC
  Filled 2021-03-26: qty 10

## 2021-03-26 MED ORDER — MEPERIDINE HCL 50 MG/ML IJ SOLN
INTRAMUSCULAR | Status: DC | PRN
  Administered 2021-03-26 (×2): 25 mg via INTRAVENOUS

## 2021-03-26 MED ORDER — MIDAZOLAM HCL 5 MG/5ML IJ SOLN
INTRAMUSCULAR | Status: DC | PRN
  Administered 2021-03-26: 1 mg via INTRAVENOUS
  Administered 2021-03-26 (×2): 2 mg via INTRAVENOUS
  Administered 2021-03-26: 1 mg via INTRAVENOUS

## 2021-03-26 MED ORDER — MEPERIDINE HCL 50 MG/ML IJ SOLN
INTRAMUSCULAR | Status: AC
  Filled 2021-03-26: qty 1

## 2021-03-26 MED ORDER — STERILE WATER FOR IRRIGATION IR SOLN
Status: DC | PRN
  Administered 2021-03-26: 1.5 mL

## 2021-03-26 MED ORDER — SODIUM CHLORIDE 0.9 % IV SOLN: INTRAVENOUS | Status: DC
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
## 2021-03-26 NOTE — Op Note (Signed)
[facility] Patient Name: Emily Sloan Procedure Date: 03/26/2021 9:56 AM MRN: [id number] Date of Birth: Dec 30, 1950 Attending MD: Hildred Laser , MD CSN: [id number] Age: 70 Admit Type: Outpatient Procedure:                Colonoscopy Indications:              Screening for colorectal malignant neoplasm Providers:                Hildred Laser, MD, Charlsie Quest. Theda Sers RN, RN,                            Raphael Gibney, Technician Referring MD:             Halford Chessman, MD Medicines:                Meperidine 50 mg IV, Midazolam 6 mg IV Complications:            No immediate complications. Estimated Blood Loss:     Estimated blood loss was minimal. Procedure:                Pre-Anesthesia Assessment:                           - Prior to the procedure, a History and Physical                            was performed, and patient medications and                            allergies were reviewed. The patient's tolerance of                            previous anesthesia was also reviewed. The risks                            and benefits of the procedure and the sedation                            options and risks were discussed with the patient.                            All questions were answered, and informed consent                            was obtained. Prior Anticoagulants: The patient has                            taken no previous anticoagulant or antiplatelet                            agents. ASA Grade Assessment: II - A patient with                            mild systemic disease. After reviewing the risks  and benefits, the patient was deemed in                            satisfactory condition to undergo the procedure.                           After obtaining informed consent, the colonoscope                            was passed under direct vision. Throughout the                            procedure, the patient's blood pressure, pulse,  and                            oxygen saturations were monitored continuously. The                            [id number]-[id number] ([id number]) scope was introduced through                            the anus and advanced to the the cecum, identified                            by appendiceal orifice and ileocecal valve. The                            colonoscopy was somewhat difficult due to a                            redundant colon. Successful completion of the                            procedure was aided by scope guide. The patient                            tolerated the procedure well. The quality of the                            bowel preparation was good. The ileocecal valve,                            appendiceal orifice, and rectum were photographed. Scope In: 10:45:06 AM Scope Out: 11:16:29 AM Scope Withdrawal Time: 0 hours 12 minutes 59 seconds  Total Procedure Duration: 0 hours 31 minutes 23 seconds  Findings:      The perianal and digital rectal examinations were normal.      A 8 mm polyp was found in the proximal ascending colon. The polyp was       flat. The polyp was removed with a cold snare. Resection and retrieval       were complete. The pathology specimen was placed into Bottle Number 2.      A diminutive polyp was found in the splenic flexure. Biopsies were taken  with a cold forceps for histology. The pathology specimen was placed       into Bottle Number 1.      Scattered diverticula were found in the sigmoid colon.      External hemorrhoids were found during retroflexion. The hemorrhoids       were small. Impression:               - One 8 mm polyp in the proximal ascending colon,                            removed with a cold snare. Resected and retrieved.                           - One diminutive polyp at the splenic flexure.                            Biopsied.                           - Diverticulosis in the sigmoid colon.                           - External  hemorrhoids. Moderate Sedation:      Moderate (conscious) sedation was administered by the endoscopy nurse       and supervised by the endoscopist. The following parameters were       monitored: oxygen saturation, heart rate, blood pressure, CO2       capnography and response to care. Total physician intraservice time was       34 minutes. Recommendation:           - Patient has a contact number available for                            emergencies. The signs and symptoms of potential                            delayed complications were discussed with the                            patient. Return to normal activities tomorrow.                            Written discharge instructions were provided to the                            patient.                           - High fiber diet today.                           - Continue present medications.                           - No aspirin, ibuprofen, naproxen, or other  non-steroidal anti-inflammatory drugs for 1 day.                           - Await pathology results.                           - Repeat colonoscopy is recommended. The                            colonoscopy date will be determined after pathology                            results from today's exam become available for                            review. Procedure Code(s):        --- Professional ---                           [id number], Colonoscopy, flexible; with removal of                            tumor(s), polyp(s), or other lesion(s) by snare                            technique                           45380, 59, Colonoscopy, flexible; with biopsy,                            single or multiple                           99153, Moderate sedation; each additional 15                            minutes intraservice time                           G0500, Moderate sedation services provided by the                            same physician or other  qualified health care                            professional performing a gastrointestinal                            endoscopic service that sedation supports,                            requiring the presence of an independent trained                            observer to assist in the monitoring of the  patient's level of consciousness and physiological                            status; initial 15 minutes of intra-service time;                            patient age 40 years or older (additional time may                            be reported with [phone], as appropriate) Diagnosis Code(s):        --- Professional ---                           Z12.11, Encounter for screening for malignant                            neoplasm of colon                           K63.5, Polyp of colon                           K64.4, Residual hemorrhoidal skin tags                           K57.30, Diverticulosis of large intestine without                            perforation or abscess without bleeding CPT copyright 2019 American Medical Association. All rights reserved. The codes documented in this report are preliminary and upon coder review may  be revised to meet current compliance requirements. Hildred Laser, MD Hildred Laser, MD 03/26/2021 11:26:04 AM This report has been signed electronically. Number of Addenda: 0

## 2021-03-26 NOTE — Discharge Instructions (Signed)
No aspirin or NSAIDs for 24 hours.   °Resume usual medications as before. °High-fiber diet. °No driving for 24 hours. °Physician will call with biopsy results. °

## 2021-03-30 LAB — SURGICAL PATHOLOGY

## 2021-03-31 ENCOUNTER — Encounter (INDEPENDENT_AMBULATORY_CARE_PROVIDER_SITE_OTHER): Payer: Self-pay | Admitting: *Deleted

## 2021-04-01 ENCOUNTER — Encounter (HOSPITAL_COMMUNITY): Payer: Self-pay | Admitting: Internal Medicine

## 2021-04-23 ENCOUNTER — Other Ambulatory Visit: Payer: Self-pay

## 2021-04-23 ENCOUNTER — Ambulatory Visit
Admission: RE | Admit: 2021-04-23 | Discharge: 2021-04-23 | Disposition: A | Discharge status: Home / Self Care | Payer: Medicare HMO | Source: Ambulatory Visit | Attending: Family Medicine | Admitting: Family Medicine

## 2021-04-23 DIAGNOSIS — Z1231 Encounter for screening mammogram for malignant neoplasm of breast: Secondary | ICD-10-CM | POA: Diagnosis not present

## 2021-07-02 ENCOUNTER — Other Ambulatory Visit: Payer: Self-pay

## 2021-07-02 ENCOUNTER — Other Ambulatory Visit (HOSPITAL_COMMUNITY): Payer: Self-pay | Admitting: Family Medicine

## 2021-07-02 ENCOUNTER — Ambulatory Visit (HOSPITAL_COMMUNITY)
Admission: RE | Admit: 2021-07-02 | Discharge: 2021-07-02 | Disposition: A | Discharge status: Home / Self Care | Payer: Medicare HMO | Source: Ambulatory Visit | Attending: Family Medicine | Admitting: Family Medicine

## 2021-07-02 DIAGNOSIS — R0789 Other chest pain: Secondary | ICD-10-CM

## 2021-07-02 DIAGNOSIS — J449 Chronic obstructive pulmonary disease, unspecified: Secondary | ICD-10-CM | POA: Diagnosis not present

## 2021-07-02 DIAGNOSIS — R079 Chest pain, unspecified: Secondary | ICD-10-CM | POA: Diagnosis not present

## 2021-07-02 DIAGNOSIS — Z87891 Personal history of nicotine dependence: Secondary | ICD-10-CM | POA: Diagnosis not present

## 2021-07-02 DIAGNOSIS — E7849 Other hyperlipidemia: Secondary | ICD-10-CM | POA: Diagnosis not present

## 2021-07-02 DIAGNOSIS — Z23 Encounter for immunization: Secondary | ICD-10-CM | POA: Diagnosis not present

## 2021-07-02 DIAGNOSIS — E782 Mixed hyperlipidemia: Secondary | ICD-10-CM | POA: Diagnosis not present

## 2021-07-02 DIAGNOSIS — K5732 Diverticulitis of large intestine without perforation or abscess without bleeding: Secondary | ICD-10-CM | POA: Diagnosis not present

## 2021-07-02 DIAGNOSIS — M5030 Other cervical disc degeneration, unspecified cervical region: Secondary | ICD-10-CM | POA: Diagnosis not present

## 2021-07-02 DIAGNOSIS — J302 Other seasonal allergic rhinitis: Secondary | ICD-10-CM | POA: Diagnosis not present

## 2021-07-02 DIAGNOSIS — Z720 Tobacco use: Secondary | ICD-10-CM | POA: Diagnosis not present

## 2021-12-15 DIAGNOSIS — H353131 Nonexudative age-related macular degeneration, bilateral, early dry stage: Secondary | ICD-10-CM | POA: Diagnosis not present

## 2022-01-11 DIAGNOSIS — Z681 Body mass index (BMI) 19 or less, adult: Secondary | ICD-10-CM | POA: Diagnosis not present

## 2022-01-11 DIAGNOSIS — R030 Elevated blood-pressure reading, without diagnosis of hypertension: Secondary | ICD-10-CM | POA: Diagnosis not present

## 2022-01-11 DIAGNOSIS — N390 Urinary tract infection, site not specified: Secondary | ICD-10-CM | POA: Diagnosis not present

## 2022-01-22 DIAGNOSIS — Z681 Body mass index (BMI) 19 or less, adult: Secondary | ICD-10-CM | POA: Diagnosis not present

## 2022-01-22 DIAGNOSIS — R030 Elevated blood-pressure reading, without diagnosis of hypertension: Secondary | ICD-10-CM | POA: Diagnosis not present

## 2022-01-22 DIAGNOSIS — N390 Urinary tract infection, site not specified: Secondary | ICD-10-CM | POA: Diagnosis not present

## 2022-01-22 DIAGNOSIS — I10 Essential (primary) hypertension: Secondary | ICD-10-CM | POA: Diagnosis not present

## 2022-04-12 DIAGNOSIS — J449 Chronic obstructive pulmonary disease, unspecified: Secondary | ICD-10-CM | POA: Diagnosis not present

## 2022-04-12 DIAGNOSIS — E7849 Other hyperlipidemia: Secondary | ICD-10-CM | POA: Diagnosis not present

## 2022-04-12 DIAGNOSIS — E782 Mixed hyperlipidemia: Secondary | ICD-10-CM | POA: Diagnosis not present

## 2022-04-12 DIAGNOSIS — Z87891 Personal history of nicotine dependence: Secondary | ICD-10-CM | POA: Diagnosis not present

## 2022-04-12 DIAGNOSIS — I10 Essential (primary) hypertension: Secondary | ICD-10-CM | POA: Diagnosis not present

## 2022-04-12 DIAGNOSIS — Z1331 Encounter for screening for depression: Secondary | ICD-10-CM | POA: Diagnosis not present

## 2022-04-12 DIAGNOSIS — Z720 Tobacco use: Secondary | ICD-10-CM | POA: Diagnosis not present

## 2022-04-12 DIAGNOSIS — Z681 Body mass index (BMI) 19 or less, adult: Secondary | ICD-10-CM | POA: Diagnosis not present

## 2022-04-12 DIAGNOSIS — Z0001 Encounter for general adult medical examination with abnormal findings: Secondary | ICD-10-CM | POA: Diagnosis not present

## 2022-05-31 ENCOUNTER — Encounter: Payer: Self-pay | Admitting: Cardiology

## 2022-05-31 ENCOUNTER — Ambulatory Visit: Payer: Medicare HMO | Attending: Cardiology | Admitting: Cardiology

## 2022-05-31 VITALS — BP 122/84 | HR 69 | Ht 64.0 in | Wt 103.4 lb

## 2022-05-31 DIAGNOSIS — R0609 Other forms of dyspnea: Secondary | ICD-10-CM | POA: Diagnosis not present

## 2022-05-31 NOTE — Patient Instructions (Addendum)
Medication Instructions:  Continue all current medications.  Labwork: none  Testing/Procedures: Your physician has requested that you have an echocardiogram. Echocardiography is a painless test that uses sound waves to create images of your heart. It provides your doctor with information about the size and shape of your heart and how well your heart's chambers and valves are working. This procedure takes approximately one hour. There are no restrictions for this procedure.  Office will contact with results via phone, letter or mychart.     Follow-Up: Pending test result   Any Other Special Instructions Will Be Listed Below (If Applicable).   If you need a refill on your cardiac medications before your next appointment, please call your pharmacy.
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
## 2022-06-02 ENCOUNTER — Ambulatory Visit: Payer: Medicare HMO | Attending: Cardiology

## 2022-06-02 DIAGNOSIS — R0609 Other forms of dyspnea: Secondary | ICD-10-CM

## 2022-06-02 LAB — ECHOCARDIOGRAM COMPLETE
AR max vel: 1.67 cm2
AV Area VTI: 2.04 cm2
AV Area mean vel: 1.93 cm2
AV Mean grad: 4.4 mmHg
AV Peak grad: 10.5 mmHg
Ao pk vel: 1.62 m/s
Area-P 1/2: 2.21 cm2
Calc EF: 70.4 %
S' Lateral: 1.43 cm
Single Plane A2C EF: 67.8 %
Single Plane A4C EF: 73.2 %

## 2022-06-10 ENCOUNTER — Telehealth: Payer: Self-pay | Admitting: Cardiology

## 2022-06-10 ENCOUNTER — Telehealth: Payer: Self-pay | Admitting: *Deleted

## 2022-06-10 DIAGNOSIS — R0609 Other forms of dyspnea: Secondary | ICD-10-CM

## 2022-06-10 NOTE — Telephone Encounter (Signed)
Laurine Blazer, LPN  10/07/3886  7:57 PM EDT Back to Top    Notified, copy to pcp.  She agrees to doing the GXT - she should take her inhaler with her per JB  --agh

## 2022-06-10 NOTE — Telephone Encounter (Signed)
-----   Message from Arnoldo Lenis, MD sent at 06/08/2022 12:47 PM EDT ----- Normal echo, please order GXT for dyspnea on exertion  Zandra Abts MD

## 2022-06-10 NOTE — Telephone Encounter (Signed)
Checking percert on the following patient for testing scheduled at [facility].     GXT   06-18-2022

## 2022-06-17 ENCOUNTER — Encounter (HOSPITAL_COMMUNITY): Payer: Medicare HMO

## 2022-06-18 ENCOUNTER — Ambulatory Visit (HOSPITAL_COMMUNITY)
Admission: RE | Admit: 2022-06-18 | Discharge: 2022-06-18 | Disposition: A | Discharge status: Home / Self Care | Payer: Medicare HMO | Source: Ambulatory Visit | Attending: Cardiology | Admitting: Cardiology

## 2022-06-18 DIAGNOSIS — R0609 Other forms of dyspnea: Secondary | ICD-10-CM | POA: Diagnosis not present

## 2022-06-18 LAB — EXERCISE TOLERANCE TEST
Angina Index: 0
Duke Treadmill Score: 4
Estimated workload: 5.5
Exercise duration (min): 3 min
Exercise duration (sec): 31 s
MPHR: 152 {beats}/min
Peak HR: 137 {beats}/min
Percent HR: 90 %
Rest HR: 72 {beats}/min
ST Depression (mm): 0 mm

## 2022-06-22 ENCOUNTER — Telehealth: Payer: Self-pay | Admitting: *Deleted

## 2022-06-22 NOTE — Telephone Encounter (Signed)
Laurine Blazer, LPN  9/61/1643  5:39 PM EDT Back to Top    Notified, copy to pcp. States she would like to hold off on the PFT's for now.  States that she knows she needs to stop smoking.  Her plan is to work on this over the next 6 months to a year and then re-evaluate things.  Will place recall for 6 months.

## 2022-06-22 NOTE — Telephone Encounter (Signed)
-----   Message from Arnoldo Lenis, MD sent at 06/21/2022  2:14 PM EDT ----- Exercise stress test was benign. Nothing cardiac to indicate the cause of her symptoms, please order PFTs for shortness of breath  Zandra Abts MD

## 2022-12-20 DIAGNOSIS — H353132 Nonexudative age-related macular degeneration, bilateral, intermediate dry stage: Secondary | ICD-10-CM | POA: Diagnosis not present

## 2022-12-29 ENCOUNTER — Other Ambulatory Visit (HOSPITAL_COMMUNITY): Payer: Self-pay | Admitting: Family Medicine

## 2022-12-29 DIAGNOSIS — Z681 Body mass index (BMI) 19 or less, adult: Secondary | ICD-10-CM | POA: Diagnosis not present

## 2022-12-29 DIAGNOSIS — Z1231 Encounter for screening mammogram for malignant neoplasm of breast: Secondary | ICD-10-CM

## 2022-12-29 DIAGNOSIS — I10 Essential (primary) hypertension: Secondary | ICD-10-CM | POA: Diagnosis not present

## 2023-01-12 ENCOUNTER — Ambulatory Visit (HOSPITAL_COMMUNITY)
Admission: RE | Admit: 2023-01-12 | Discharge: 2023-01-12 | Disposition: A | Discharge status: Home / Self Care | Payer: Medicare HMO | Source: Ambulatory Visit | Attending: Family Medicine | Admitting: Family Medicine

## 2023-01-12 ENCOUNTER — Encounter (HOSPITAL_COMMUNITY): Payer: Self-pay

## 2023-01-12 DIAGNOSIS — Z1231 Encounter for screening mammogram for malignant neoplasm of breast: Secondary | ICD-10-CM | POA: Diagnosis not present

## 2023-03-02 ENCOUNTER — Ambulatory Visit: Payer: Medicare HMO | Admitting: Nurse Practitioner

## 2023-03-02 ENCOUNTER — Telehealth: Payer: Self-pay | Admitting: Cardiology

## 2023-03-02 NOTE — Telephone Encounter (Signed)
I spoke with patient concerning the incident this morning. Patient appreciated the call back and , was okay. She stated that she had felt a lot better since the last time she was here. She was going to let Lanora Manis know that today. She said she would give [place] a call back if she needed to schedule if problems accrue. She thanked me for the quick call back. I apologized to her for the incident. She was very understanding and said things happen.
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
## 2023-03-02 NOTE — Telephone Encounter (Signed)
Patient was scheduled for this morning at 10:30 AM with Sharlene Dory, NP. She states she sat in the lobby for 1.5 hours and she was never called back. She states she finally left the office and she would like to know what happened to her appointment this morning. Please advise.

## 2023-04-18 DIAGNOSIS — Z87891 Personal history of nicotine dependence: Secondary | ICD-10-CM | POA: Diagnosis not present

## 2023-04-18 DIAGNOSIS — Z681 Body mass index (BMI) 19 or less, adult: Secondary | ICD-10-CM | POA: Diagnosis not present

## 2023-04-18 DIAGNOSIS — Z1331 Encounter for screening for depression: Secondary | ICD-10-CM | POA: Diagnosis not present

## 2023-04-18 DIAGNOSIS — I10 Essential (primary) hypertension: Secondary | ICD-10-CM | POA: Diagnosis not present

## 2023-04-18 DIAGNOSIS — Z0001 Encounter for general adult medical examination with abnormal findings: Secondary | ICD-10-CM | POA: Diagnosis not present

## 2023-04-18 DIAGNOSIS — E782 Mixed hyperlipidemia: Secondary | ICD-10-CM | POA: Diagnosis not present

## 2023-04-18 DIAGNOSIS — E7849 Other hyperlipidemia: Secondary | ICD-10-CM | POA: Diagnosis not present

## 2023-10-06 DIAGNOSIS — L609 Nail disorder, unspecified: Secondary | ICD-10-CM | POA: Diagnosis not present

## 2023-10-06 DIAGNOSIS — M79672 Pain in left foot: Secondary | ICD-10-CM | POA: Diagnosis not present

## 2023-10-06 DIAGNOSIS — L600 Ingrowing nail: Secondary | ICD-10-CM | POA: Diagnosis not present

## 2023-10-06 DIAGNOSIS — M79675 Pain in left toe(s): Secondary | ICD-10-CM | POA: Diagnosis not present

## 2023-10-06 DIAGNOSIS — B351 Tinea unguium: Secondary | ICD-10-CM | POA: Diagnosis not present

## 2023-10-18 DIAGNOSIS — M79672 Pain in left foot: Secondary | ICD-10-CM | POA: Diagnosis not present

## 2023-10-18 DIAGNOSIS — M79674 Pain in right toe(s): Secondary | ICD-10-CM | POA: Diagnosis not present

## 2023-10-18 DIAGNOSIS — M79675 Pain in left toe(s): Secondary | ICD-10-CM | POA: Diagnosis not present

## 2023-10-18 DIAGNOSIS — B351 Tinea unguium: Secondary | ICD-10-CM | POA: Diagnosis not present

## 2023-10-18 DIAGNOSIS — M79671 Pain in right foot: Secondary | ICD-10-CM | POA: Diagnosis not present

## 2023-10-18 DIAGNOSIS — L609 Nail disorder, unspecified: Secondary | ICD-10-CM | POA: Diagnosis not present

## 2023-12-21 DIAGNOSIS — H353110 Nonexudative age-related macular degeneration, right eye, stage unspecified: Secondary | ICD-10-CM | POA: Diagnosis not present

## 2024-04-11 ENCOUNTER — Other Ambulatory Visit (HOSPITAL_COMMUNITY): Payer: Self-pay | Admitting: Family Medicine

## 2024-04-11 DIAGNOSIS — Z1231 Encounter for screening mammogram for malignant neoplasm of breast: Secondary | ICD-10-CM

## 2024-04-20 ENCOUNTER — Ambulatory Visit (HOSPITAL_COMMUNITY)
Admission: RE | Admit: 2024-04-20 | Discharge: 2024-04-20 | Disposition: A | Discharge status: Home / Self Care | Source: Ambulatory Visit | Attending: Family Medicine | Admitting: Family Medicine

## 2024-04-20 DIAGNOSIS — Z1231 Encounter for screening mammogram for malignant neoplasm of breast: Secondary | ICD-10-CM | POA: Insufficient documentation

## 2024-06-18 DIAGNOSIS — Z681 Body mass index (BMI) 19 or less, adult: Secondary | ICD-10-CM | POA: Diagnosis not present

## 2024-06-18 DIAGNOSIS — R238 Other skin changes: Secondary | ICD-10-CM | POA: Diagnosis not present

## 2024-06-19 DIAGNOSIS — Z87891 Personal history of nicotine dependence: Secondary | ICD-10-CM | POA: Diagnosis not present

## 2024-06-19 DIAGNOSIS — F17210 Nicotine dependence, cigarettes, uncomplicated: Secondary | ICD-10-CM | POA: Diagnosis not present

## 2024-06-19 DIAGNOSIS — Z122 Encounter for screening for malignant neoplasm of respiratory organs: Secondary | ICD-10-CM | POA: Diagnosis not present

## 2024-07-09 DIAGNOSIS — Z1329 Encounter for screening for other suspected endocrine disorder: Secondary | ICD-10-CM | POA: Diagnosis not present

## 2024-07-09 DIAGNOSIS — I10 Essential (primary) hypertension: Secondary | ICD-10-CM | POA: Diagnosis not present

## 2024-07-09 DIAGNOSIS — J449 Chronic obstructive pulmonary disease, unspecified: Secondary | ICD-10-CM | POA: Diagnosis not present

## 2024-07-09 DIAGNOSIS — F17200 Nicotine dependence, unspecified, uncomplicated: Secondary | ICD-10-CM | POA: Diagnosis not present

## 2024-07-09 DIAGNOSIS — Z23 Encounter for immunization: Secondary | ICD-10-CM | POA: Diagnosis not present

## 2024-07-09 DIAGNOSIS — D559 Anemia due to enzyme disorder, unspecified: Secondary | ICD-10-CM | POA: Diagnosis not present

## 2024-07-09 DIAGNOSIS — J301 Allergic rhinitis due to pollen: Secondary | ICD-10-CM | POA: Diagnosis not present

## 2024-07-09 DIAGNOSIS — Z1321 Encounter for screening for nutritional disorder: Secondary | ICD-10-CM | POA: Diagnosis not present

## 2024-09-05 DIAGNOSIS — I10 Essential (primary) hypertension: Secondary | ICD-10-CM | POA: Diagnosis not present
